# Patient Record
Sex: Female | Born: 1960 | Race: White | Hispanic: No | Marital: Married | Smoking: Never smoker
Health system: Southern US, Community
[De-identification: ages and names within clinical notes are randomized; demographics above are authoritative.]

## PROBLEM LIST (undated history)

## (undated) DIAGNOSIS — F329 Major depressive disorder, single episode, unspecified: Secondary | ICD-10-CM

## (undated) DIAGNOSIS — I10 Essential (primary) hypertension: Secondary | ICD-10-CM

## (undated) DIAGNOSIS — T7840XA Allergy, unspecified, initial encounter: Secondary | ICD-10-CM

## (undated) DIAGNOSIS — J019 Acute sinusitis, unspecified: Secondary | ICD-10-CM

## (undated) DIAGNOSIS — F419 Anxiety disorder, unspecified: Secondary | ICD-10-CM

## (undated) DIAGNOSIS — F32A Depression, unspecified: Secondary | ICD-10-CM

## (undated) DIAGNOSIS — E039 Hypothyroidism, unspecified: Secondary | ICD-10-CM

## (undated) DIAGNOSIS — K219 Gastro-esophageal reflux disease without esophagitis: Secondary | ICD-10-CM

## (undated) DIAGNOSIS — K2970 Gastritis, unspecified, without bleeding: Secondary | ICD-10-CM

## (undated) DIAGNOSIS — J45909 Unspecified asthma, uncomplicated: Secondary | ICD-10-CM

## (undated) DIAGNOSIS — K449 Diaphragmatic hernia without obstruction or gangrene: Secondary | ICD-10-CM

## (undated) DIAGNOSIS — G4730 Sleep apnea, unspecified: Secondary | ICD-10-CM

## (undated) HISTORY — DX: Sleep apnea, unspecified: G47.30

## (undated) HISTORY — PX: GASTRIC BYPASS: SHX52

## (undated) HISTORY — DX: Anxiety disorder, unspecified: F41.9

## (undated) HISTORY — PX: COLECTOMY: SHX59

## (undated) HISTORY — DX: Allergy, unspecified, initial encounter: T78.40XA

---

## 1994-08-22 HISTORY — PX: ABDOMINAL HYSTERECTOMY: SHX81

## 2007-03-10 ENCOUNTER — Ambulatory Visit: Payer: Self-pay | Admitting: Emergency Medicine

## 2007-03-10 ENCOUNTER — Inpatient Hospital Stay: Payer: Self-pay | Admitting: Internal Medicine

## 2008-10-31 IMAGING — CR DG ABDOMEN 3V
1 series · 4 of 4 positions shown · non-contrast
Comparison: none

REASON FOR EXAM: llq abd pain and constipated
COMMENTS:

PROCEDURE:     MDR - MDR ABDOMEN 3-WAY (INCL PA CH)  - March 10, 2007 [DATE]
RESULT:     Comparison: No available comparison exam.

[Series 1: view not recorded · 0.17mm/px · 4 of 4 slices shown]
[im 1/4]
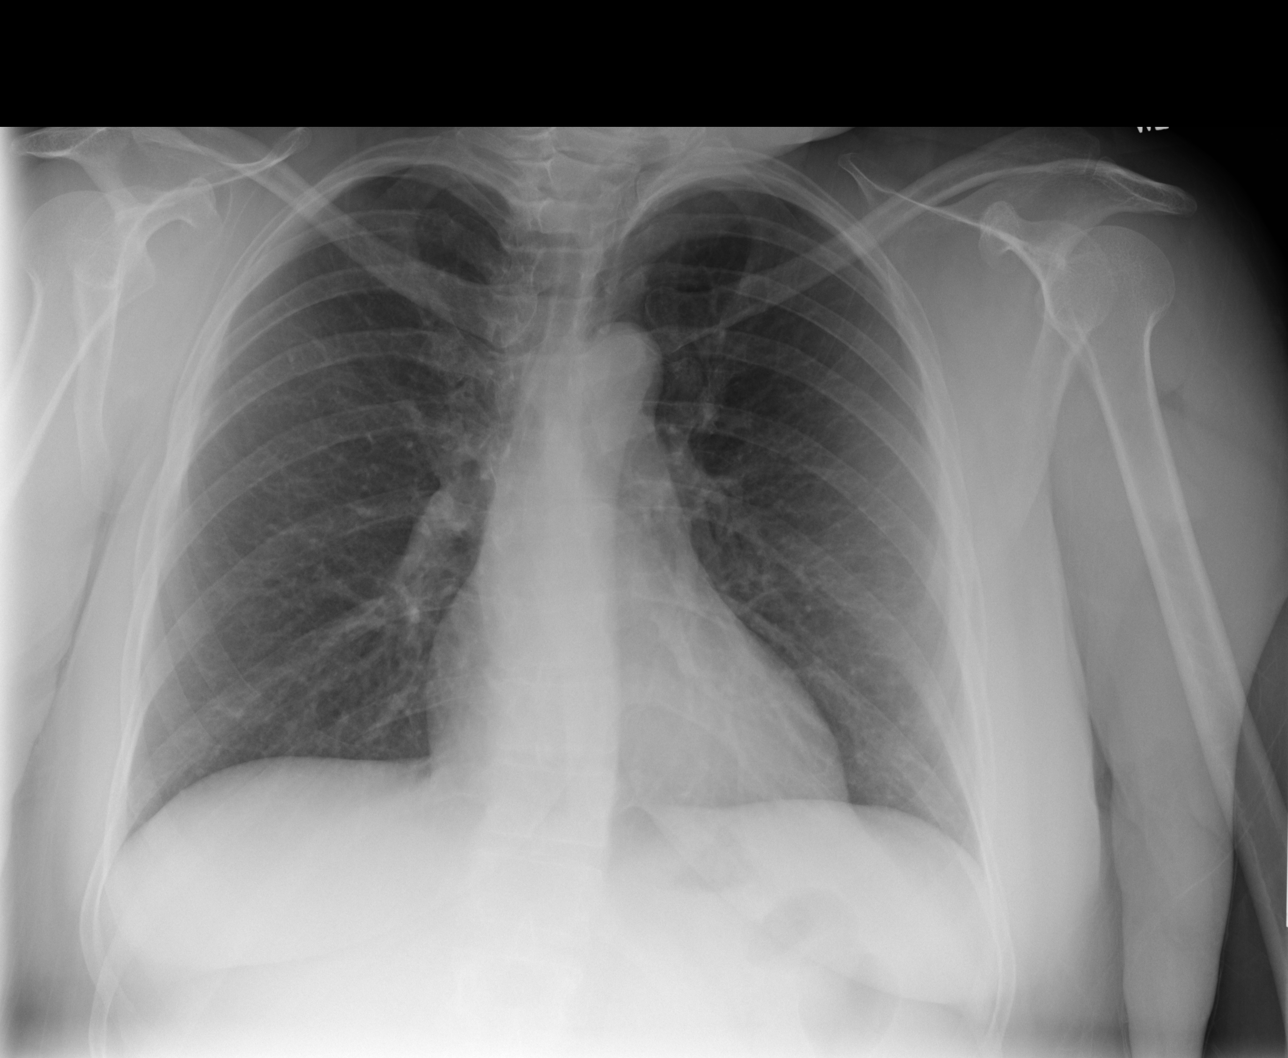
[im 2/4]
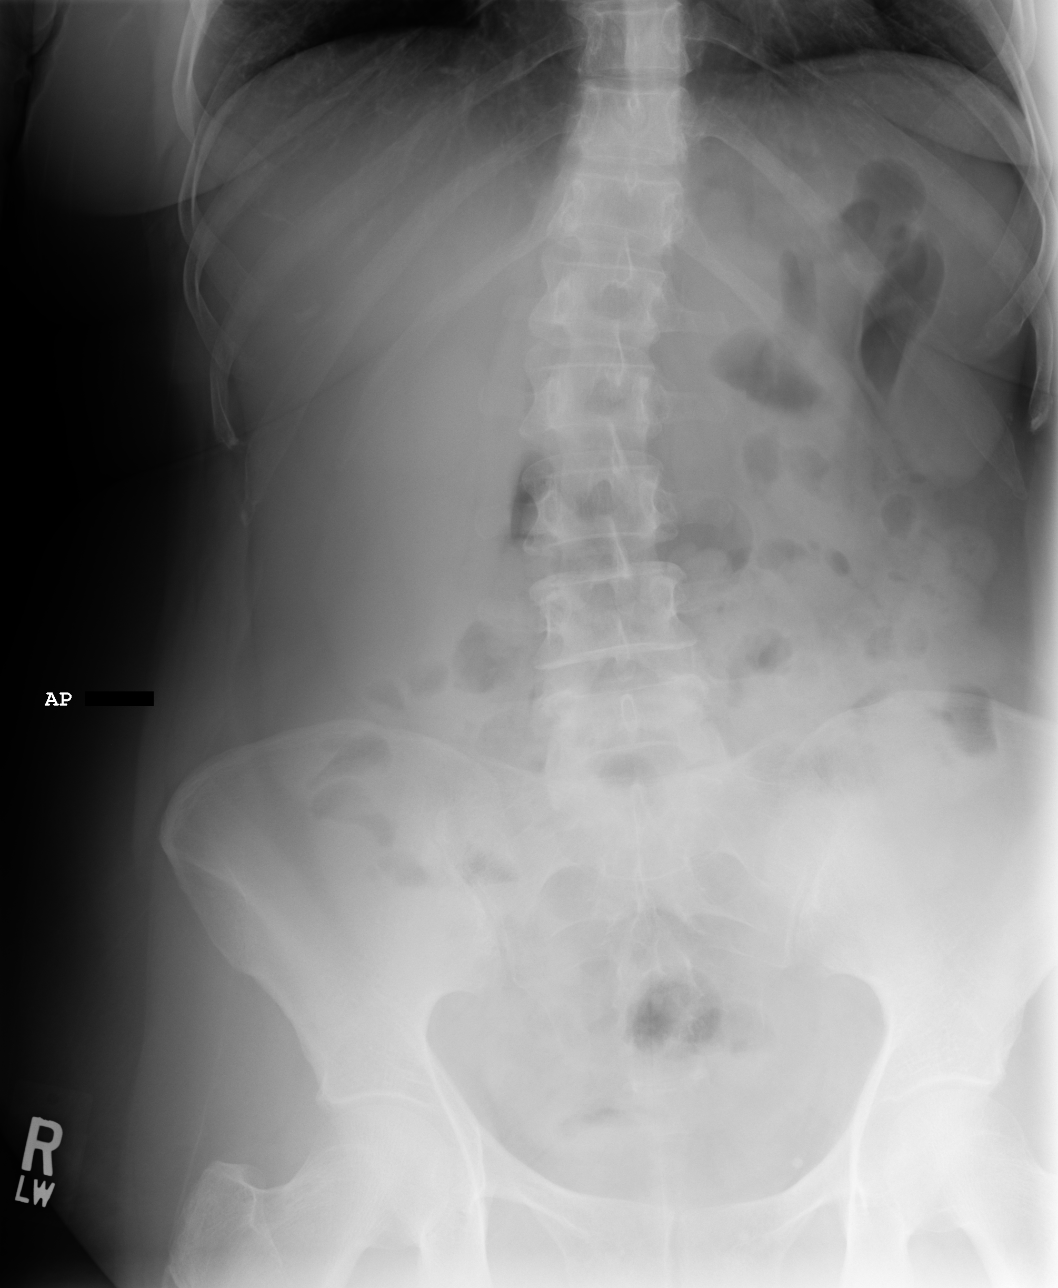
[im 3/4]
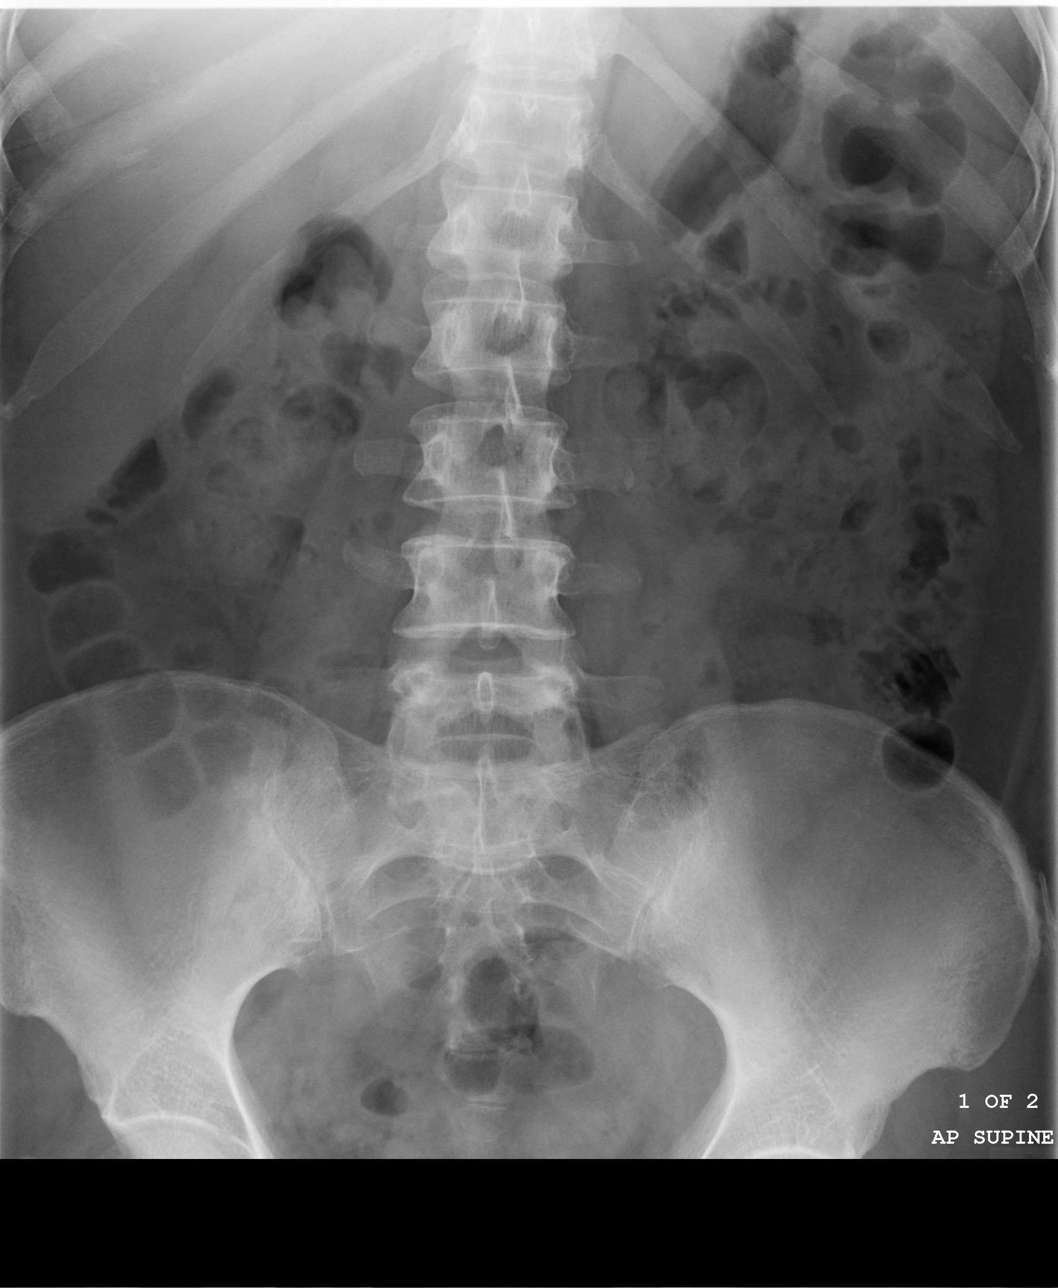
[im 4/4]
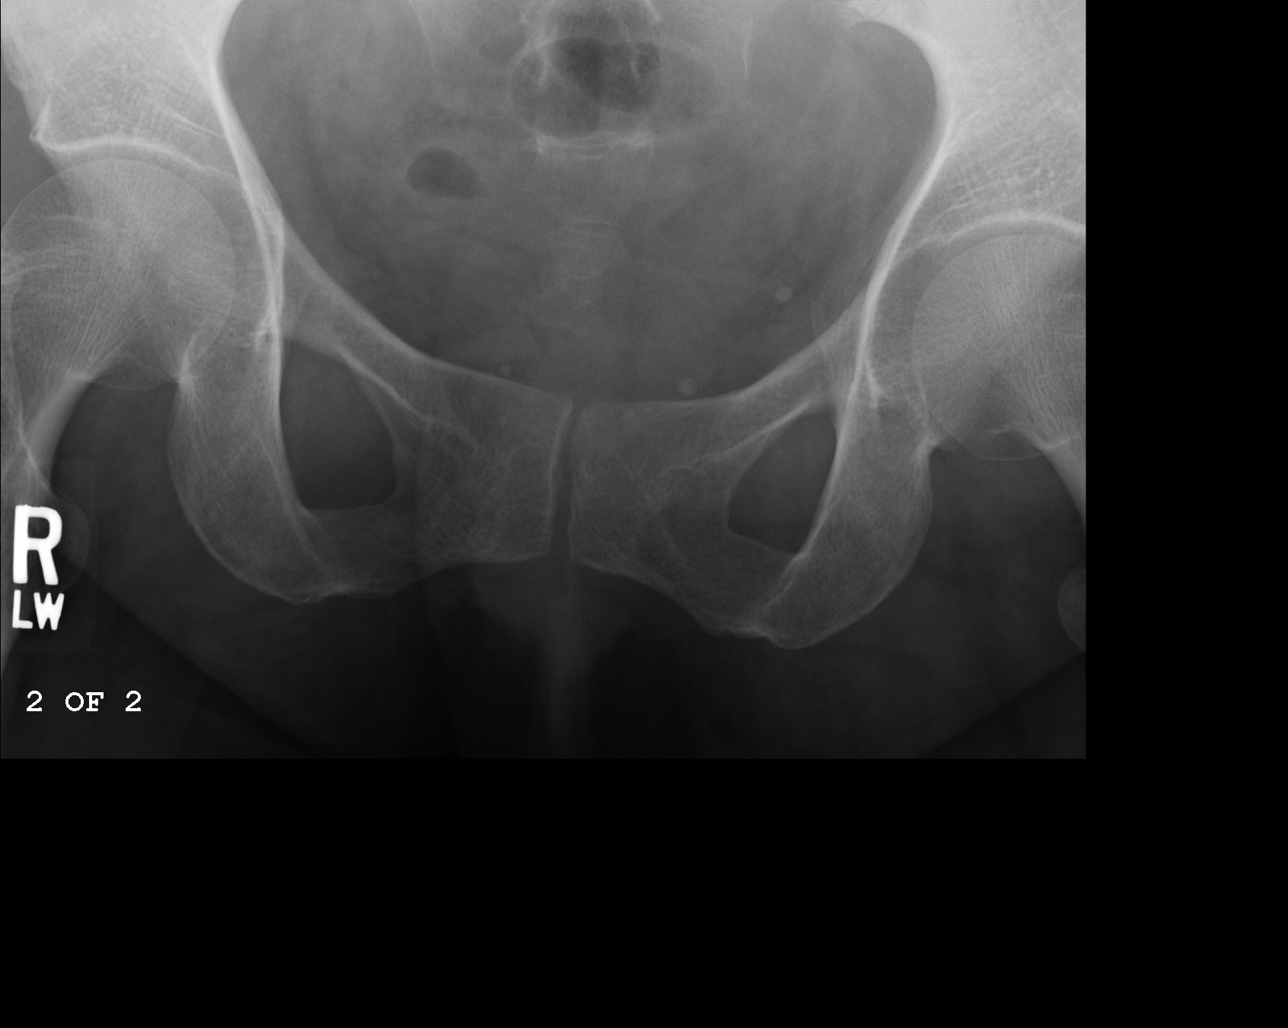

[4 of 4 positions shown; findings below may reference images not displayed]

FINDINGS: There is no significant pulmonary consolidation, pulmonary edema, pleural
effusion, nor pneumothorax. The cardiomediastinal silhouette is within
normal limits.

No displaced rib fracture is noted.

There is nonobstructive bowel gas pattern. Ther is no intraperitoneal free
air. Small amount of stool is noted.
IMPRESSION: 1. Nonobstructive bowel gas pattern. Small amount of stool is noted.

## 2008-10-31 IMAGING — CT CT ABD-PELV W/ CM
1 of 2 series · 15 of 32 positions shown, 19 images · non-contrast
Comparison: none

REASON FOR EXAM: (1) abd pain; (2) abd pain
COMMENTS:

PROCEDURE:     CT  - CT ABDOMEN / PELVIS  W  - March 10, 2007  [DATE]
RESULT:     Comparison: No available comparison exam.
TECHNIQUE: CT examination of the abdomen and pelvis was performed after
intravenous administration of 100 cc of Esovue-HSE nonionic contrast in
addition to oral contrast. Collimation is 5 mm.

[Series 2: abdomen · axial · 0.76mm/px · z∈[-524,-104]mm · 15 of 94 slices shown, 19 images]
[im 5/94  soft-tissue]
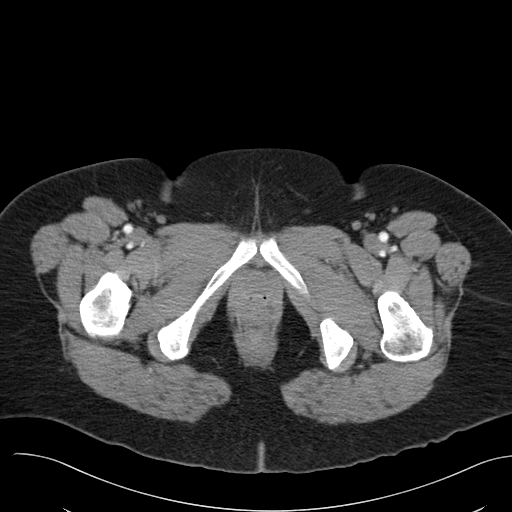
[im 5/94  bone]
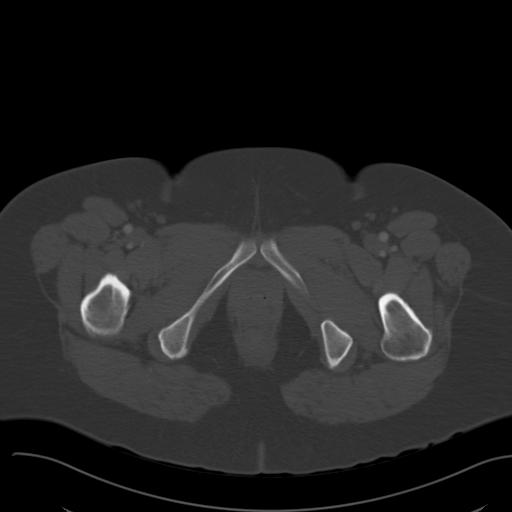
[im 13/94  soft-tissue]
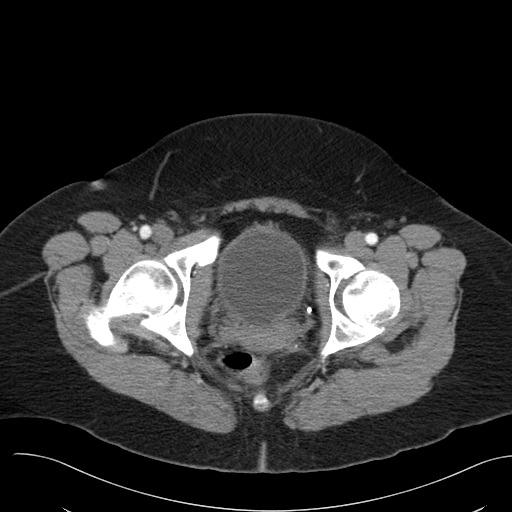
[im 21/94  soft-tissue]
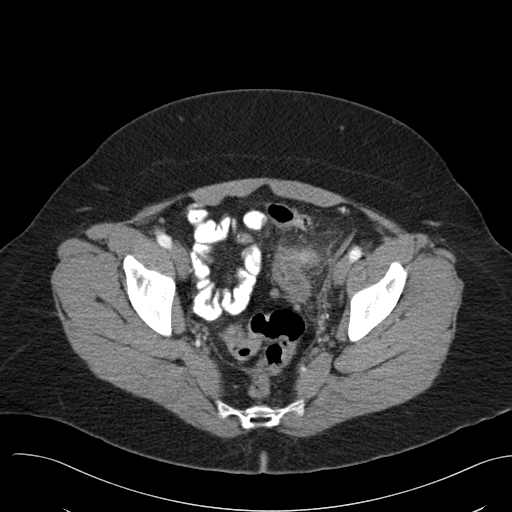
[im 25/94  soft-tissue]
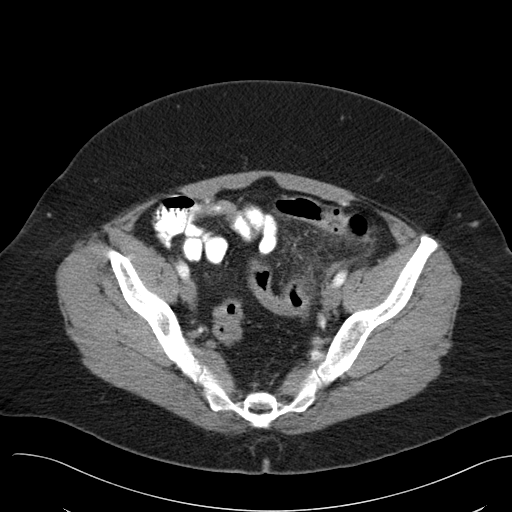
[im 33/94  soft-tissue]
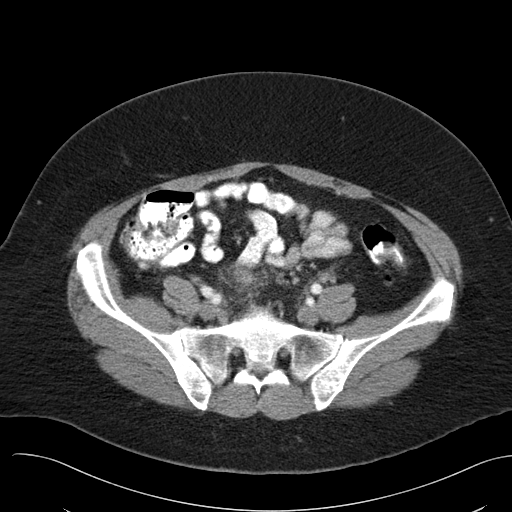
[im 41/94  soft-tissue]
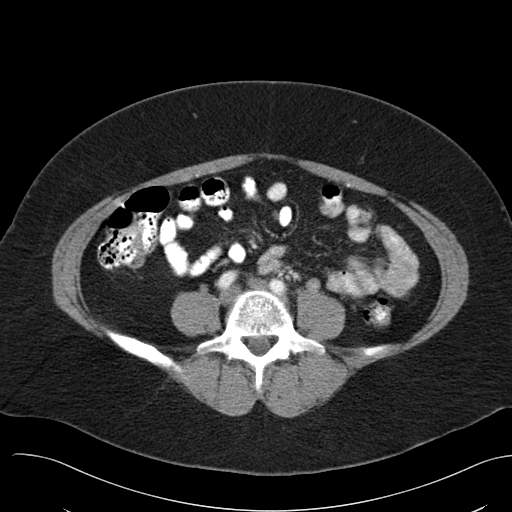
[im 49/94  soft-tissue]
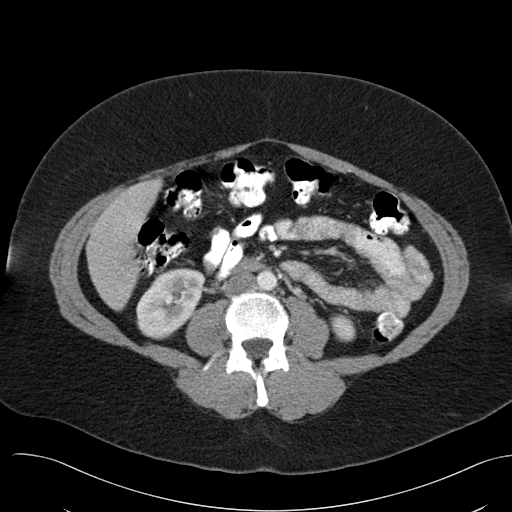
[im 53/94  soft-tissue]
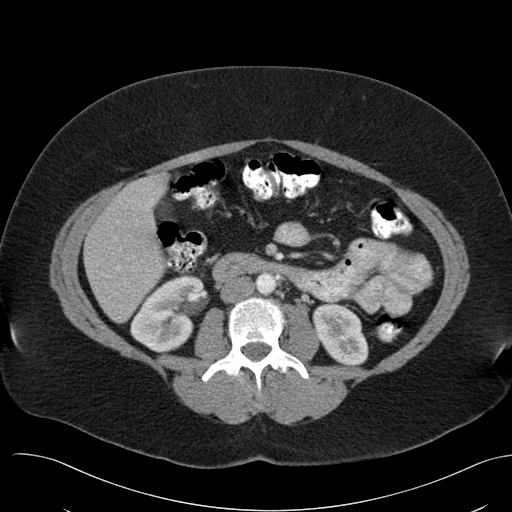
[im 61/94  soft-tissue]
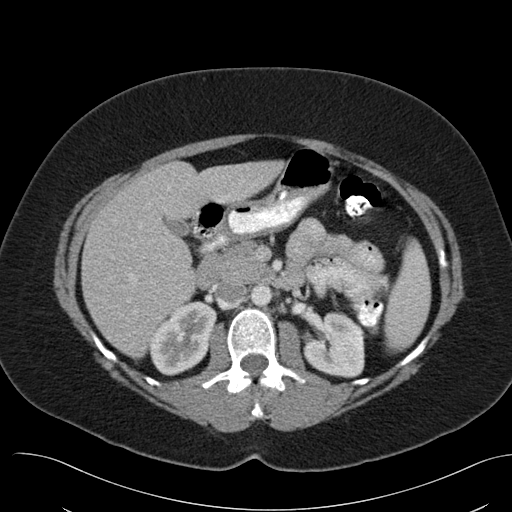
[im 61/94  bone]
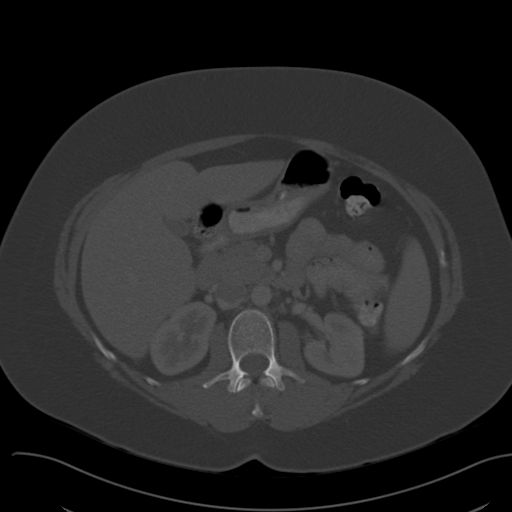
[im 69/94  soft-tissue]
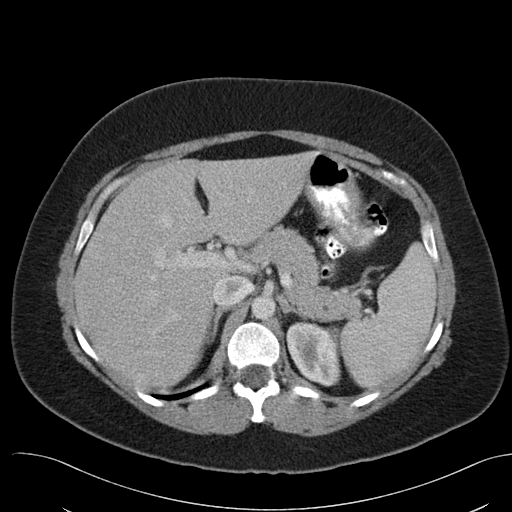
[im 73/94  soft-tissue]
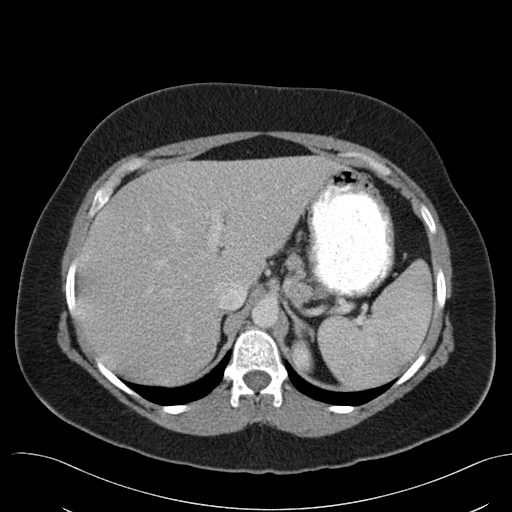
[im 77/94  lung]
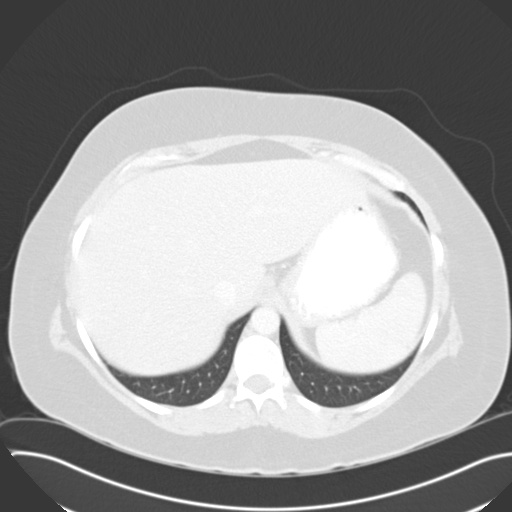
[im 81/94  soft-tissue]
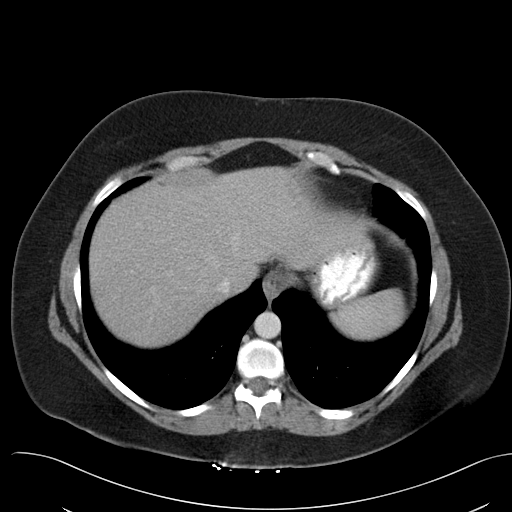
[im 81/94  lung]
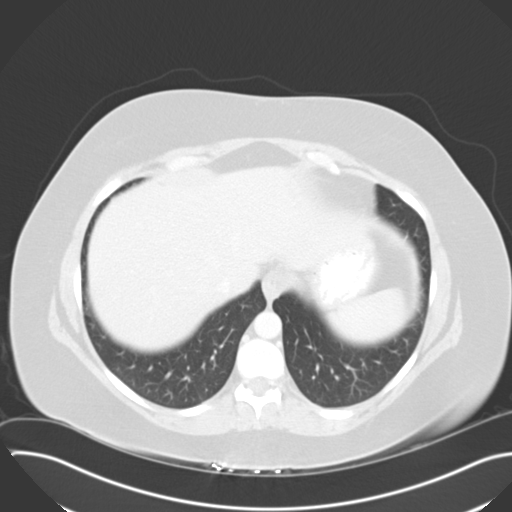
[im 85/94  lung]
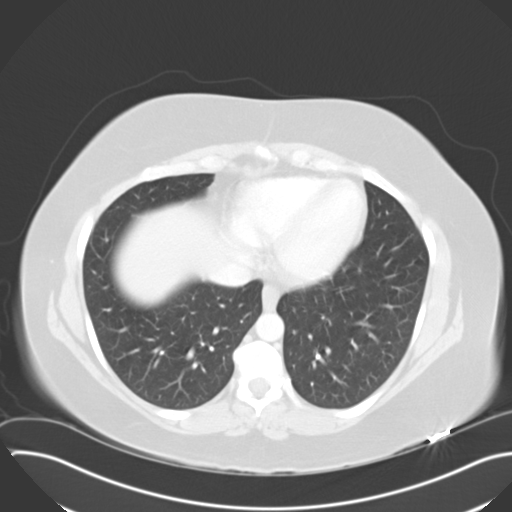
[im 89/94  soft-tissue]
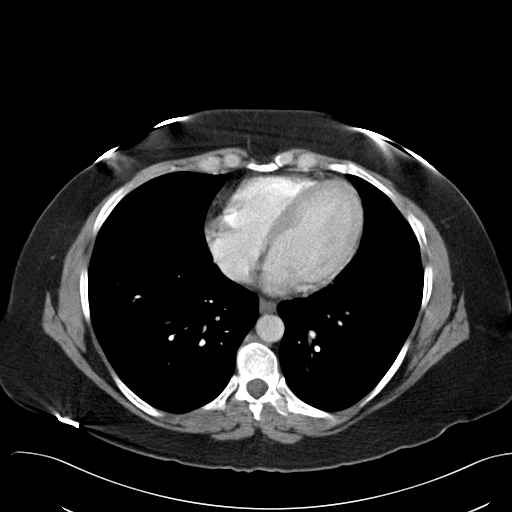
[im 89/94  lung]
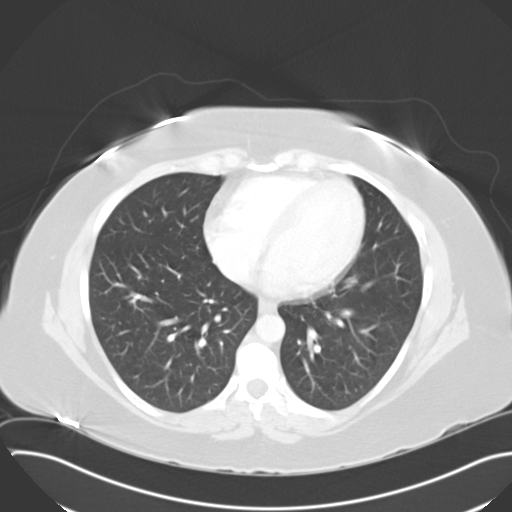

[15 of 32 positions shown; findings below may reference images not displayed]

FINDINGS: Limited evaluation of the lung bases is unremarkable

The liver, gallbladder, spleen, adrenal glands and kidneys are unremarkable.
3 mm low attenuation focus is seen involving the pancreatic tail is
nonspecific and of uncertain clinical significance.

There is sigmoid colonic wall thickening with adjacent fat stranding,
consistent with diverticulitis. There is no bowel obstruction. The appendix
is now well seen. There is no intraperitoneal free air. There are no
enlarged abdominal pelvic lymph nodes.
IMPRESSION: 1. Findings are suggestive of sigmoid diverticulitis. There is no evidence
of gross intraperitoneal free air. There is no abscess. If patient has not
had a recent colonoscopy, would recommend colonoscopy after the acute
management of diverticulitis to exclude any underlying colonic neoplasm.

2. 3 mm low attenuation focus in the pancreatic tail is nonspecific and of
uncertain clinical significance.

Findings were called to the ER at [DATE] on 03/10/07.

## 2011-04-11 DIAGNOSIS — F329 Major depressive disorder, single episode, unspecified: Secondary | ICD-10-CM | POA: Insufficient documentation

## 2011-04-11 DIAGNOSIS — I10 Essential (primary) hypertension: Secondary | ICD-10-CM | POA: Insufficient documentation

## 2011-04-11 DIAGNOSIS — M8580 Other specified disorders of bone density and structure, unspecified site: Secondary | ICD-10-CM | POA: Insufficient documentation

## 2011-04-11 DIAGNOSIS — E039 Hypothyroidism, unspecified: Secondary | ICD-10-CM | POA: Insufficient documentation

## 2011-04-11 DIAGNOSIS — E669 Obesity, unspecified: Secondary | ICD-10-CM | POA: Insufficient documentation

## 2012-07-25 DIAGNOSIS — K2970 Gastritis, unspecified, without bleeding: Secondary | ICD-10-CM | POA: Insufficient documentation

## 2012-07-25 DIAGNOSIS — K449 Diaphragmatic hernia without obstruction or gangrene: Secondary | ICD-10-CM | POA: Insufficient documentation

## 2012-07-25 DIAGNOSIS — K222 Esophageal obstruction: Secondary | ICD-10-CM | POA: Insufficient documentation

## 2013-03-25 ENCOUNTER — Ambulatory Visit: Payer: Self-pay | Admitting: Internal Medicine

## 2015-10-03 ENCOUNTER — Ambulatory Visit
Admission: EM | Admit: 2015-10-03 | Discharge: 2015-10-03 | Disposition: A | Discharge status: Home / Self Care | Payer: BLUE CROSS/BLUE SHIELD | Attending: Family Medicine | Admitting: Family Medicine

## 2015-10-03 ENCOUNTER — Encounter: Payer: Self-pay | Admitting: Gynecology

## 2015-10-03 DIAGNOSIS — J45901 Unspecified asthma with (acute) exacerbation: Secondary | ICD-10-CM

## 2015-10-03 DIAGNOSIS — J0100 Acute maxillary sinusitis, unspecified: Secondary | ICD-10-CM

## 2015-10-03 HISTORY — DX: Unspecified asthma, uncomplicated: J45.909

## 2015-10-03 HISTORY — DX: Diaphragmatic hernia without obstruction or gangrene: K44.9

## 2015-10-03 HISTORY — DX: Major depressive disorder, single episode, unspecified: F32.9

## 2015-10-03 HISTORY — DX: Gastritis, unspecified, without bleeding: K29.70

## 2015-10-03 HISTORY — DX: Essential (primary) hypertension: I10

## 2015-10-03 HISTORY — DX: Gastro-esophageal reflux disease without esophagitis: K21.9

## 2015-10-03 HISTORY — DX: Hypothyroidism, unspecified: E03.9

## 2015-10-03 HISTORY — DX: Depression, unspecified: F32.A

## 2015-10-03 HISTORY — DX: Acute sinusitis, unspecified: J01.90

## 2015-10-03 MED ORDER — IPRATROPIUM-ALBUTEROL 0.5-2.5 (3) MG/3ML IN SOLN
3.0000 mL | Freq: Once | RESPIRATORY_TRACT | Status: AC
  Administered 2015-10-03: 3 mL via RESPIRATORY_TRACT

## 2015-10-03 MED ORDER — AZITHROMYCIN 250 MG PO TABS: ORAL_TABLET | ORAL | Status: DC

## 2015-10-03 MED ORDER — GUAIFENESIN-CODEINE 100-10 MG/5ML PO SOLN: ORAL | Status: DC

## 2015-10-03 MED ORDER — FLUCONAZOLE 150 MG PO TABS: 150.0000 mg | ORAL_TABLET | Freq: Every day | ORAL | Status: DC

## 2015-10-03 MED ORDER — PREDNISONE 20 MG PO TABS: ORAL_TABLET | ORAL | Status: DC

## 2015-10-03 NOTE — ED Notes (Addendum)
Patient c/o coughing / shortness of breath / and her peak flow is less than 200 x last pm. Per patient use her asthma medication when needed. Per patient taken her Advair / Albuterol and her Ventolin inhaler but not feeling any better. Patient stated coughing greenish mucous.

## 2015-10-03 NOTE — ED Provider Notes (Signed)
CSN: [id number]     Arrival date & time 10/03/15  0806 History   First MD Initiated Contact with Patient 10/03/15 [phone]     Chief Complaint  Patient presents with  . Asthma   (Consider location/radiation/quality/duration/timing/severity/associated sxs/prior Treatment) Patient is a 55 y.o. female presenting with asthma and URI. The history is provided by the patient.  Asthma Associated symptoms include headaches.  URI Presenting symptoms: congestion, cough, facial pain, fatigue, fever and rhinorrhea   Severity:  Moderate Onset quality:  Sudden Duration:  2 weeks Timing:  Constant Progression:  Worsening Chronicity:  New Relieved by:  Nothing Ineffective treatments:  OTC medications and prescription medications Associated symptoms: headaches, sinus pain and wheezing   Associated symptoms: no neck pain, no sneezing and no swollen glands   Risk factors: chronic respiratory disease (mild, persistent asthma)     Past Medical History  Diagnosis Date  . Hypertension   . Hypothyroidism   . Asthma   . Acute sinusitis   . Gastritis   . Hiatal hernia   . GERD (gastroesophageal reflux disease)   . Depression    Past Surgical History  Procedure Laterality Date  . Abdominal hysterectomy    . Colectomy     No family history on file. Social History  Substance Use Topics  . Smoking status: Never Smoker   . Smokeless tobacco: None  . Alcohol Use: No   OB History    No data available     Review of Systems  Constitutional: Positive for fever and fatigue.  HENT: Positive for congestion and rhinorrhea. Negative for sneezing.   Respiratory: Positive for cough and wheezing.   Musculoskeletal: Negative for neck pain.  Neurological: Positive for headaches.    Allergies  Wheat bran and Phentermine  Home Medications   Prior to Admission medications   Medication Sig Start Date End Date Taking? Authorizing Provider  albuterol (PROVENTIL HFA;VENTOLIN HFA) 108 (90 Base) MCG/ACT  inhaler Inhale into the lungs every 6 (six) hours as needed for wheezing or shortness of breath.   Yes Historical Provider, MD  aspirin 81 MG tablet Take 81 mg by mouth daily.   Yes Historical Provider, MD  cetirizine (ZYRTEC) 10 MG tablet Take 10 mg by mouth daily.   Yes Historical Provider, MD  fluticasone (FLONASE) 50 MCG/ACT nasal spray Place into both nostrils daily.   Yes Historical Provider, MD  Fluticasone-Salmeterol (ADVAIR) 250-50 MCG/DOSE AEPB Inhale 1 puff into the lungs 2 (two) times daily.   Yes Historical Provider, MD  levothyroxine (SYNTHROID, LEVOTHROID) 100 MCG tablet Take 100 mcg by mouth daily before breakfast.   Yes Historical Provider, MD  montelukast (SINGULAIR) 10 MG tablet Take 10 mg by mouth at bedtime.   Yes Historical Provider, MD  ramipril (ALTACE) 10 MG capsule Take 10 mg by mouth daily.   Yes Historical Provider, MD  sertraline (ZOLOFT) 100 MG tablet Take 200 mg by mouth daily.   Yes Historical Provider, MD  traZODone (DESYREL) 50 MG tablet Take 50 mg by mouth at bedtime.   Yes Historical Provider, MD  azithromycin (ZITHROMAX Z-PAK) 250 MG tablet 2 tabs po once day 1, then 1 tab po qd for next 4 days 10/03/15   Norval Gable, MD  fluconazole (DIFLUCAN) 150 MG tablet Take 1 tablet (150 mg total) by mouth daily. 10/03/15   Norval Gable, MD  guaiFENesin-codeine 100-10 MG/5ML syrup 10 ml po q 8 hours prn cough 10/03/15   Norval Gable, MD  predniSONE (DELTASONE) 20 MG tablet  3 tabs po qd for 2 days, then 2 tabs po qd for 4 days, then 1 tab po qd for 4 days, then half a tab po qd for 3 days. 10/03/15   Norval Gable, MD   Meds Ordered and Administered this Visit   Medications  ipratropium-albuterol (DUONEB) 0.5-2.5 (3) MG/3ML nebulizer solution 3 mL (3 mLs Nebulization Given 10/03/15 0918)    BP 135/69 mmHg  Pulse 83  Temp(Src) 98.7 F (37.1 C) (Oral)  Resp 18  Ht 5\' 5"  (1.651 m)  Wt 220 lb (99.791 kg)  BMI 36.61 kg/m2  SpO2 96% No data found.   Physical Exam   Constitutional: She appears well-developed and well-nourished. No distress.  HENT:  Head: Normocephalic and atraumatic.  Right Ear: Tympanic membrane, external ear and ear canal normal.  Left Ear: Tympanic membrane, external ear and ear canal normal.  Nose: Mucosal edema and rhinorrhea present. No nose lacerations, sinus tenderness, nasal deformity, septal deviation or nasal septal hematoma. No epistaxis.  No foreign bodies. Right sinus exhibits maxillary sinus tenderness and frontal sinus tenderness. Left sinus exhibits maxillary sinus tenderness and frontal sinus tenderness.  Mouth/Throat: Uvula is midline, oropharynx is clear and moist and mucous membranes are normal. No oropharyngeal exudate.  Eyes: Conjunctivae and EOM are normal. Pupils are equal, round, and reactive to light. Right eye exhibits no discharge. Left eye exhibits no discharge. No scleral icterus.  Neck: Normal range of motion. Neck supple. No thyromegaly present.  Cardiovascular: Normal rate, regular rhythm and normal heart sounds.   Pulmonary/Chest: Effort normal. No respiratory distress (diffuse expiratory wheezes and rhonchi bilaterally). She has wheezes. She has no rales.  Lymphadenopathy:    She has no cervical adenopathy.  Skin: She is not diaphoretic.  Nursing note and vitals reviewed.   ED Course  Procedures (including critical care time)  Labs Review Labs Reviewed - No data to display  Imaging Review No results found.   Visual Acuity Review  Right Eye Distance:   Left Eye Distance:   Bilateral Distance:    Right Eye Near:   Left Eye Near:    Bilateral Near:         MDM   1. Asthma exacerbation   2. Acute maxillary sinusitis, recurrence not specified    New Prescriptions   AZITHROMYCIN (ZITHROMAX Z-PAK) 250 MG TABLET    2 tabs po once day 1, then 1 tab po qd for next 4 days   FLUCONAZOLE (DIFLUCAN) 150 MG TABLET    Take 1 tablet (150 mg total) by mouth daily.   GUAIFENESIN-CODEINE 100-10  MG/5ML SYRUP    10 ml po q 8 hours prn cough   PREDNISONE (DELTASONE) 20 MG TABLET    3 tabs po qd for 2 days, then 2 tabs po qd for 4 days, then 1 tab po qd for 4 days, then half a tab po qd for 3 days.   1. diagnosis reviewed with patient 2. rx as per orders above; reviewed possible side effects, interactions, risks and benefits  3. Recommend supportive treatment with increased fluids, rest, otc cough medication prn 4. Follow-up prn if symptoms worsen or don't improve    Norval Gable, MD 10/03/15 [phone]

## 2015-12-25 DIAGNOSIS — G4733 Obstructive sleep apnea (adult) (pediatric): Secondary | ICD-10-CM | POA: Insufficient documentation

## 2016-01-12 DIAGNOSIS — J45909 Unspecified asthma, uncomplicated: Secondary | ICD-10-CM | POA: Insufficient documentation

## 2016-10-26 DIAGNOSIS — N6489 Other specified disorders of breast: Secondary | ICD-10-CM | POA: Insufficient documentation

## 2017-01-06 DIAGNOSIS — M2550 Pain in unspecified joint: Secondary | ICD-10-CM | POA: Insufficient documentation

## 2017-01-06 DIAGNOSIS — M654 Radial styloid tenosynovitis [de Quervain]: Secondary | ICD-10-CM | POA: Insufficient documentation

## 2017-01-06 DIAGNOSIS — D179 Benign lipomatous neoplasm, unspecified: Secondary | ICD-10-CM | POA: Insufficient documentation

## 2017-01-06 DIAGNOSIS — M5136 Other intervertebral disc degeneration, lumbar region: Secondary | ICD-10-CM | POA: Insufficient documentation

## 2017-01-06 DIAGNOSIS — M7050 Other bursitis of knee, unspecified knee: Secondary | ICD-10-CM | POA: Insufficient documentation

## 2017-01-06 DIAGNOSIS — M7061 Trochanteric bursitis, right hip: Secondary | ICD-10-CM | POA: Insufficient documentation

## 2017-01-25 DIAGNOSIS — Z800 Family history of malignant neoplasm of digestive organs: Secondary | ICD-10-CM | POA: Insufficient documentation

## 2017-10-26 DIAGNOSIS — M130 Polyarthritis, unspecified: Secondary | ICD-10-CM | POA: Insufficient documentation

## 2017-10-26 DIAGNOSIS — Z0000 Encounter for general adult medical examination without abnormal findings: Secondary | ICD-10-CM | POA: Insufficient documentation

## 2018-05-23 DIAGNOSIS — N6320 Unspecified lump in the left breast, unspecified quadrant: Secondary | ICD-10-CM | POA: Insufficient documentation

## 2018-11-06 ENCOUNTER — Ambulatory Visit: Payer: Self-pay | Admitting: Family Medicine

## 2018-11-15 ENCOUNTER — Ambulatory Visit: Payer: Self-pay | Admitting: Family Medicine

## 2019-01-07 ENCOUNTER — Encounter: Payer: Self-pay | Admitting: Family Medicine

## 2019-01-07 ENCOUNTER — Ambulatory Visit (INDEPENDENT_AMBULATORY_CARE_PROVIDER_SITE_OTHER): Payer: BLUE CROSS/BLUE SHIELD | Admitting: Family Medicine

## 2019-01-07 ENCOUNTER — Other Ambulatory Visit: Payer: Self-pay

## 2019-01-07 VITALS — BP 138/70 | HR 68 | Ht 65.0 in | Wt 234.0 lb

## 2019-01-07 DIAGNOSIS — R7989 Other specified abnormal findings of blood chemistry: Secondary | ICD-10-CM

## 2019-01-07 DIAGNOSIS — M545 Low back pain, unspecified: Secondary | ICD-10-CM

## 2019-01-07 DIAGNOSIS — G8929 Other chronic pain: Secondary | ICD-10-CM

## 2019-01-07 DIAGNOSIS — E782 Mixed hyperlipidemia: Secondary | ICD-10-CM

## 2019-01-07 DIAGNOSIS — I10 Essential (primary) hypertension: Secondary | ICD-10-CM | POA: Diagnosis not present

## 2019-01-07 DIAGNOSIS — M2550 Pain in unspecified joint: Secondary | ICD-10-CM

## 2019-01-07 DIAGNOSIS — E034 Atrophy of thyroid (acquired): Secondary | ICD-10-CM

## 2019-01-07 DIAGNOSIS — Z7689 Persons encountering health services in other specified circumstances: Secondary | ICD-10-CM

## 2019-01-07 DIAGNOSIS — Z6838 Body mass index (BMI) 38.0-38.9, adult: Secondary | ICD-10-CM

## 2019-01-07 MED ORDER — MELOXICAM 7.5 MG PO TABS: 7.5000 mg | ORAL_TABLET | Freq: Every day | ORAL | 5 refills | Status: DC

## 2019-01-07 MED ORDER — LEVOTHYROXINE SODIUM 100 MCG PO TABS: 100.0000 ug | ORAL_TABLET | Freq: Every day | ORAL | 1 refills | Status: DC

## 2019-01-07 MED ORDER — RAMIPRIL 10 MG PO CAPS: 10.0000 mg | ORAL_CAPSULE | Freq: Every day | ORAL | 1 refills | Status: DC

## 2019-01-07 NOTE — Patient Instructions (Signed)

## 2019-01-07 NOTE — Addendum Note (Signed)
Addended by: Juline Patch on: 01/07/2019 12:25 PM   Modules accepted: Level of Service

## 2019-01-07 NOTE — Progress Notes (Signed)
Date:  01/07/2019   Name:  Molly Horn   DOB:  [birth year]   MRN:  [id number]   Chief Complaint: [place] (needs repeat cbc - elevated platelets and a lipid) and Arthritis  Patient is a 58 year old female who presents for an establish care exam. The patient reports the following problems: multiple medical issues/backpain. Health maintenance has been reviewed up to date  Arthritis  Presents for initial visit. The disease course has been fluctuating. She complains of pain and stiffness. Affected locations include the neck, left shoulder, right shoulder, left hip, right hip, right knee, left knee, right PIP, left PIP, right DIP, left DIP and right MCP. Pertinent negatives include no diarrhea, dry eyes, dry mouth, dysuria, fatigue, fever, pain at night, pain while resting, rash, Raynaud's syndrome, uveitis or weight loss. There is no history of chronic back pain, lupus, osteoarthritis, psoriasis or rheumatoid arthritis.  Past treatments include OTC med. The treatment provided moderate relief. Factors aggravating her arthritis include activity.  Hyperlipidemia  This is a chronic problem. The current episode started more than 1 year ago. The problem is uncontrolled. Recent lipid tests were reviewed and are normal. Exacerbating diseases include hypothyroidism and obesity. She has no history of chronic renal disease, diabetes, liver disease or nephrotic syndrome. Pertinent negatives include no chest pain, myalgias or shortness of breath. Current antihyperlipidemic treatment includes diet change.  Hypertension  This is a chronic problem. The current episode started more than 1 year ago. The problem has been gradually worsening since onset. The problem is controlled. Pertinent negatives include no anxiety, blurred vision, chest pain, headaches, malaise/fatigue, neck pain, orthopnea, palpitations, peripheral edema, PND, shortness of breath or sweats. There are no associated agents to hypertension. Risk  factors for coronary artery disease include dyslipidemia and obesity. Past treatments include ACE inhibitors. The current treatment provides moderate improvement. Identifiable causes of hypertension include a thyroid problem. There is no history of chronic renal disease.  Thyroid Problem  Presents for follow-up visit. Symptoms include weight gain. Patient reports no anxiety, cold intolerance, constipation, depressed mood, diaphoresis, diarrhea, fatigue, hair loss, heat intolerance, hoarse voice, leg swelling, nail problem, palpitations, tremors, visual change or weight loss. Her past medical history is significant for hyperlipidemia. There is no history of diabetes.    Review of Systems  Constitutional: Positive for weight gain. Negative for chills, diaphoresis, fatigue, fever, malaise/fatigue and weight loss.  HENT: Negative for drooling, ear discharge, ear pain, hoarse voice and sore throat.   Eyes: Negative for blurred vision.  Respiratory: Negative for cough, shortness of breath and wheezing.   Cardiovascular: Negative for chest pain, palpitations, orthopnea, leg swelling and PND.  Gastrointestinal: Negative for abdominal pain, blood in stool, constipation, diarrhea and nausea.  Endocrine: Negative for cold intolerance, heat intolerance and polydipsia.  Genitourinary: Negative for dysuria, frequency, hematuria and urgency.  Musculoskeletal: Positive for arthralgias, arthritis, back pain and stiffness. Negative for myalgias and neck pain.  Skin: Negative for rash.  Allergic/Immunologic: Negative for environmental allergies.  Neurological: Negative for dizziness, tremors, numbness and headaches.  Hematological: Does not bruise/bleed easily.  Psychiatric/Behavioral: Negative for suicidal ideas. The patient is not nervous/anxious.     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   Routine history and physical  examination of adult 10/26/2017   Family history of colon cancer 01/25/2017   Arthralgia 01/06/2017   Degenerative lumbar disc 01/06/2017   Lipoma 01/06/2017  Pes anserine bursitis 01/06/2017   Tenosynovitis, de Quervain 01/06/2017   Trochanteric bursitis of both hips 01/06/2017   Radial scar of breast 10/26/2016   Asthma without status asthmaticus 01/12/2016   Moderate obstructive sleep apnea 12/25/2015   Gastritis 07/25/2012   Hiatal hernia 07/25/2012   Schatzki's ring 07/25/2012   Acquired hypothyroidism 04/11/2011   Obesity 04/11/2011   Depression 04/11/2011   Essential hypertension 04/11/2011   Osteopenia 04/11/2011    Allergies  Allergen Reactions   Wheat Bran Anaphylaxis   Phentermine     Make her sick    Past Surgical History:  Procedure Laterality Date   ABDOMINAL HYSTERECTOMY  1996   COLECTOMY     resection due to removal of mass benign    Social History   Tobacco Use   Smoking status: Never Smoker   Smokeless tobacco: Never Used  Substance Use Topics   Alcohol use: Yes    Comment: socially   Drug use: No     Medication list has been reviewed and updated.  Current Meds  Medication Sig   albuterol (PROVENTIL HFA;VENTOLIN HFA) 108 (90 Base) MCG/ACT inhaler Inhale into the lungs every 6 (six) hours as needed for wheezing or shortness of breath.   aspirin 81 MG tablet Take 81 mg by mouth daily.   levothyroxine (SYNTHROID) 100 MCG tablet Take 1 tablet by mouth daily.   levothyroxine (SYNTHROID, LEVOTHROID) 100 MCG tablet Take 100 mcg by mouth daily before breakfast.   Multiple Vitamin (MULTIVITAMIN) capsule Take 1 capsule by mouth daily.   ramipril (ALTACE) 10 MG capsule Take 10 mg by mouth daily.   venlafaxine XR (EFFEXOR-XR) 150 MG 24 hr capsule Take 1 capsule by mouth daily. psych    PHQ 2/9 Scores 01/07/2019  PHQ - 2 Score 1  PHQ- 9 Score 2    BP Readings from Last 3 Encounters:  01/07/19 138/70  10/03/15  135/69    Physical Exam Vitals signs and nursing note reviewed.  Constitutional:      General: She is not in acute distress.    Appearance: She is not diaphoretic.  HENT:     Head: Normocephalic and atraumatic.     Right Ear: Tympanic membrane, ear canal and external ear normal.     Left Ear: Tympanic membrane, ear canal and external ear normal.     Nose: Nose normal.  Eyes:     General:        Right eye: No discharge.        Left eye: No discharge.     Conjunctiva/sclera: Conjunctivae normal.     Pupils: Pupils are equal, round, and reactive to light.     Funduscopic exam:    Right eye: No AV nicking.        Left eye: No AV nicking.  Neck:     Musculoskeletal: Normal range of motion and neck supple.     Thyroid: No thyromegaly.     Vascular: No JVD.  Cardiovascular:     Rate and Rhythm: Normal rate and regular rhythm.     Heart sounds: Normal heart sounds. No murmur. No friction rub. No gallop.   Pulmonary:     Effort: Pulmonary effort is normal.     Breath sounds: Normal breath sounds.  Abdominal:     General: Bowel sounds are normal.     Palpations: Abdomen is soft. There is no mass.     Tenderness: There is no abdominal tenderness. There is no guarding.  Musculoskeletal: Normal  range of motion.     Lumbar back: She exhibits spasm.       Back:     Comments: Palpable subcutaneous nodule over left SI joint  Lymphadenopathy:     Cervical: No cervical adenopathy.  Skin:    General: Skin is warm and dry.  Neurological:     Mental Status: She is alert.     Deep Tendon Reflexes: Reflexes are normal and symmetric.     Wt Readings from Last 3 Encounters:  01/07/19 234 lb (106.1 kg)  10/03/15 220 lb (99.8 kg)    BP 138/70    Pulse 68    Ht 5\' 5"  (1.651 m)    Wt 234 lb (106.1 kg)    BMI 38.94 kg/m   Assessment and Plan:  1. BMI 38.0-38.9,adult Health risks of being over weight were discussed and patient was counseled on weight loss options and exercise.  2.  Establishing care with new doctor, encounter for And establishes with new physician.  Patient's medications diagnoses and previous labs and x-rays were reviewed.  3. Mixed hyperlipidemia Patient has had some elevation in lipids in the past and will recheck lipid panel to evaluate this. - Lipid panel  4. Essential hypertension Controlled.  Chronic.  Continue ramipril 10 mg once a day.   - ramipril (ALTACE) 10 MG capsule; Take 1 capsule (10 mg total) by mouth daily.  Dispense: 90 capsule; Refill: 1  5. Arthralgia, unspecified joint We will check a sed rate because of multiple areas of arthralgias.  Will start on Mobic 7.5 mg once a day. - Sedimentation rate - meloxicam (MOBIC) 7.5 MG tablet; Take 1 tablet (7.5 mg total) by mouth daily.  Dispense: 30 tablet; Refill: 5  6. Chronic left-sided low back pain without sciatica Patient has lower back pain for chronic period of time will initiate meloxicam 7.5 mg - Sedimentation rate - meloxicam (MOBIC) 7.5 MG tablet; Take 1 tablet (7.5 mg total) by mouth daily.  Dispense: 30 tablet; Refill: 5  7. Hypothyroidism due to acquired atrophy of thyroid Patient has history of hypothyroidism.  He is currently controlled on levothyroxine 100 mcg daily. - levothyroxine (SYNTHROID) 100 MCG tablet; Take 1 tablet by mouth daily.  8. Elevated platelet count Patient had a mildly elevated platelet count on a previous CBC.  We will recheck a platelet count to see if this is returned to normal range. - Platelet count

## 2019-01-08 LAB — LIPID PANEL
Chol/HDL Ratio: 4.5 ratio — ABNORMAL HIGH (ref 0.0–4.4)
Cholesterol, Total: 200 mg/dL — ABNORMAL HIGH (ref 100–199)
HDL: 44 mg/dL (ref >39)
LDL Calculated: 123 mg/dL — ABNORMAL HIGH (ref 0–99)
Triglycerides: 166 mg/dL — ABNORMAL HIGH (ref 0–149)
VLDL Cholesterol Cal: 33 mg/dL (ref 5–40)

## 2019-01-08 LAB — PLATELET COUNT: Platelets: 379 10*3/uL (ref 150–450)

## 2019-01-08 LAB — SEDIMENTATION RATE: Sed Rate: 13 mm/hr (ref 0–40)

## 2019-04-09 ENCOUNTER — Other Ambulatory Visit: Payer: Self-pay

## 2019-04-09 ENCOUNTER — Ambulatory Visit (INDEPENDENT_AMBULATORY_CARE_PROVIDER_SITE_OTHER): Payer: BLUE CROSS/BLUE SHIELD | Admitting: Family Medicine

## 2019-04-09 VITALS — BP 130/84 | HR 76 | Ht 65.0 in | Wt 228.0 lb

## 2019-04-09 DIAGNOSIS — R69 Illness, unspecified: Secondary | ICD-10-CM | POA: Diagnosis not present

## 2019-04-09 DIAGNOSIS — I10 Essential (primary) hypertension: Secondary | ICD-10-CM | POA: Diagnosis not present

## 2019-04-09 DIAGNOSIS — E034 Atrophy of thyroid (acquired): Secondary | ICD-10-CM

## 2019-04-09 NOTE — Progress Notes (Signed)
Date:  04/09/2019   Name:  Molly Horn   DOB:  12-10-1960   MRN:  [id number]   Chief Complaint: Follow-up  Muscle Pain This is a chronic problem. The current episode started more than 1 year ago. The problem occurs constantly. The problem has been waxing and waning since onset. The pain is present in the right hand, lower back, right shoulder, right foot and left foot. The pain is mild. Nothing aggravates the symptoms. Pertinent negatives include no abdominal pain, chest pain, constipation, diarrhea, dysuria, eye pain, fatigue, fever, headaches, joint swelling, nausea, rash, stiffness, sensory change, shortness of breath, swollen glands, urinary symptoms, vaginal discharge, visual change, vomiting, weakness or wheezing. Past treatments include nothing. The treatment provided mild relief. Swelling is present on the hands. Her past medical history is significant for chronic back pain.  Thyroid Problem Presents for follow-up visit. Symptoms include weight gain. Patient reports no anxiety, cold intolerance, constipation, depressed mood, diaphoresis, diarrhea, fatigue, heat intolerance, hoarse voice, leg swelling, menstrual problem, palpitations, visual change or weight loss. The symptoms have been stable.  Hypertension This is a chronic problem. The current episode started more than 1 year ago. The problem has been waxing and waning since onset. The problem is controlled. Pertinent negatives include no chest pain, headaches, palpitations or shortness of breath. Identifiable causes of hypertension include a thyroid problem.    Review of Systems  Constitutional: Positive for weight gain. Negative for chills, diaphoresis, fatigue, fever, unexpected weight change and weight loss.  HENT: Negative for congestion, ear discharge, ear pain, hoarse voice, rhinorrhea, sinus pressure, sneezing and sore throat.   Eyes: Negative for photophobia, pain, discharge, redness and itching.  Respiratory: Negative for  cough, shortness of breath, wheezing and stridor.   Cardiovascular: Negative for chest pain and palpitations.  Gastrointestinal: Negative for abdominal pain, blood in stool, constipation, diarrhea, nausea and vomiting.  Endocrine: Negative for cold intolerance, heat intolerance, polydipsia, polyphagia and polyuria.  Genitourinary: Negative for dysuria, flank pain, frequency, hematuria, menstrual problem, pelvic pain, urgency, vaginal bleeding and vaginal discharge.  Musculoskeletal: Negative for arthralgias, back pain, joint swelling, myalgias and stiffness.  Skin: Negative for rash.  Allergic/Immunologic: Negative for environmental allergies and food allergies.  Neurological: Negative for dizziness, sensory change, weakness, light-headedness, numbness and headaches.  Hematological: Negative for adenopathy. Does not bruise/bleed easily.  Psychiatric/Behavioral: Negative for dysphoric mood. The patient is not nervous/anxious.     Patient Active Problem List   Diagnosis Date Noted  . Left breast lump 05/23/2018  . 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 Routine history and physical examination of adult 10/26/2017  . Family history of colon cancer 01/25/2017  . Arthralgia 01/06/2017  . Degenerative lumbar disc 01/06/2017  . Lipoma 01/06/2017  . Pes anserine bursitis 01/06/2017  . Tenosynovitis, de Quervain 01/06/2017  . Trochanteric bursitis of both hips 01/06/2017  . Radial scar of breast 10/26/2016  . Asthma without status asthmaticus 01/12/2016  . Moderate obstructive sleep apnea 12/25/2015  . Gastritis 07/25/2012  . Hiatal hernia 07/25/2012  . Schatzki's ring 07/25/2012  . Acquired hypothyroidism 04/11/2011  . Obesity 04/11/2011  . Depression 04/11/2011  . Essential hypertension 04/11/2011  . Osteopenia 04/11/2011    Allergies  Allergen Reactions  . Wheat Bran Anaphylaxis  . Phentermine     Make her sick    Past Surgical History:  Procedure Laterality Date  . ABDOMINAL  HYSTERECTOMY  1996  . COLECTOMY     resection due to removal of mass benign  Social History   Tobacco Use  . Smoking status: Never Smoker  . Smokeless tobacco: Never Used  Substance Use Topics  . Alcohol use: Yes    Comment: socially  . Drug use: No     Medication list has been reviewed and updated.  Current Meds  Medication Sig  . albuterol (PROVENTIL HFA;VENTOLIN HFA) 108 (90 Base) MCG/ACT inhaler Inhale into the lungs every 6 (six) hours as needed for wheezing or shortness of breath.  Marland Kitchen aspirin 81 MG tablet Take 81 mg by mouth every other day.   . levothyroxine (SYNTHROID) 100 MCG tablet Take 1 tablet by mouth daily.  . meloxicam (MOBIC) 7.5 MG tablet Take 1 tablet (7.5 mg total) by mouth daily.  . Multiple Vitamin (MULTIVITAMIN) capsule Take 1 capsule by mouth daily.  . ramipril (ALTACE) 10 MG capsule Take 1 capsule (10 mg total) by mouth daily.  Marland Kitchen venlafaxine XR (EFFEXOR-XR) 150 MG 24 hr capsule Take 1 capsule by mouth daily. psych  . [DISCONTINUED] levothyroxine (SYNTHROID) 100 MCG tablet Take 1 tablet (100 mcg total) by mouth daily before breakfast.    PHQ 2/9 Scores 04/09/2019 01/07/2019  PHQ - 2 Score 0 1  PHQ- 9 Score 0 2    BP Readings from Last 3 Encounters:  04/09/19 130/84  01/07/19 138/70  10/03/15 135/69    Physical Exam Vitals signs and nursing note reviewed.  Constitutional:      Appearance: She is well-developed.  HENT:     Head: Normocephalic.     Right Ear: Tympanic membrane, ear canal and external ear normal.     Left Ear: Tympanic membrane, ear canal and external ear normal.     Nose: Nose normal. No congestion or rhinorrhea.     Mouth/Throat:     Mouth: Mucous membranes are moist.  Eyes:     General: Lids are everted, no foreign bodies appreciated. No scleral icterus.       Left eye: No foreign body or hordeolum.     Conjunctiva/sclera: Conjunctivae normal.     Right eye: Right conjunctiva is not injected.     Left eye: Left  conjunctiva is not injected.     Pupils: Pupils are equal, round, and reactive to light.  Neck:     Musculoskeletal: Normal range of motion and neck supple.     Thyroid: No thyromegaly.     Vascular: No JVD.     Trachea: No tracheal deviation.  Cardiovascular:     Rate and Rhythm: Normal rate and regular rhythm.     Heart sounds: Normal heart sounds. No murmur. No friction rub. No gallop.   Pulmonary:     Effort: Pulmonary effort is normal. No respiratory distress.     Breath sounds: Normal breath sounds. No wheezing, rhonchi or rales.  Abdominal:     General: Bowel sounds are normal.     Palpations: Abdomen is soft. There is no hepatomegaly, splenomegaly or mass.     Tenderness: There is no abdominal tenderness. There is no guarding or rebound.  Musculoskeletal: Normal range of motion.        General: No tenderness.  Lymphadenopathy:     Cervical: No cervical adenopathy.  Skin:    General: Skin is warm.     Findings: No rash.  Neurological:     Mental Status: She is alert and oriented to person, place, and time.     Cranial Nerves: No cranial nerve deficit.     Deep Tendon Reflexes: Reflexes normal.  Psychiatric:        Mood and Affect: Mood is not anxious or depressed.     Wt Readings from Last 3 Encounters:  04/09/19 228 lb (103.4 kg)  01/07/19 234 lb (106.1 kg)  10/03/15 220 lb (99.8 kg)    BP 130/84   Pulse 76   Ht 5\' 5"  (1.651 m)   Wt 228 lb (103.4 kg)   BMI 37.94 kg/m   Assessment and Plan: 1. Essential hypertension Chronic.  Controlled.  We will continue the ramipril but we will obtain a renal function panel to assess renal function. - Renal Function Panel  2. Hypothyroidism due to acquired atrophy of thyroid Chronic.  Controlled.  Will continue on current dosing of levothyroxine and will check a TSH to evaluate current control. - TSH  3. Taking medication for chronic disease Patient is on meloxicam on a regular basis now for control of hand pain and  back pain.  Will obtain renal function panel to evaluate GFR for continuance of therapy. - Renal Function Panel

## 2019-04-09 NOTE — Patient Instructions (Signed)

## 2019-04-10 LAB — RENAL FUNCTION PANEL
Albumin: 4.6 g/dL (ref 3.8–4.9)
BUN/Creatinine Ratio: 16 (ref 9–23)
BUN: 14 mg/dL (ref 6–24)
CO2: 25 mmol/L (ref 20–29)
Calcium: 10.1 mg/dL (ref 8.7–10.2)
Chloride: 101 mmol/L (ref 96–106)
Creatinine, Ser: 0.85 mg/dL (ref 0.57–1.00)
GFR calc Af Amer: 88 mL/min/{1.73_m2} (ref >59)
GFR calc non Af Amer: 76 mL/min/{1.73_m2} (ref >59)
Glucose: 91 mg/dL (ref 65–99)
Phosphorus: 3.9 mg/dL (ref 3.0–4.3)
Potassium: 5 mmol/L (ref 3.5–5.2)
Sodium: 141 mmol/L (ref 134–144)

## 2019-04-10 LAB — TSH: TSH: 1.3 u[IU]/mL (ref 0.450–4.500)

## 2019-04-16 LAB — LIPID PANEL WITH LDL/HDL RATIO
Cholesterol, Total: 219 mg/dL — ABNORMAL HIGH (ref 100–199)
HDL: 46 mg/dL (ref >39)
LDL Calculated: 133 mg/dL — ABNORMAL HIGH (ref 0–99)
LDl/HDL Ratio: 2.9 ratio (ref 0.0–3.2)
Triglycerides: 200 mg/dL — ABNORMAL HIGH (ref 0–149)
VLDL Cholesterol Cal: 40 mg/dL (ref 5–40)

## 2019-04-16 LAB — SPECIMEN STATUS REPORT

## 2019-04-24 ENCOUNTER — Other Ambulatory Visit: Payer: Self-pay

## 2019-04-24 ENCOUNTER — Encounter: Payer: BLUE CROSS/BLUE SHIELD | Admitting: Family Medicine

## 2019-04-24 DIAGNOSIS — G8929 Other chronic pain: Secondary | ICD-10-CM

## 2019-04-24 DIAGNOSIS — M545 Low back pain, unspecified: Secondary | ICD-10-CM

## 2019-08-31 ENCOUNTER — Other Ambulatory Visit: Payer: Self-pay | Admitting: Family Medicine

## 2019-08-31 DIAGNOSIS — M545 Low back pain, unspecified: Secondary | ICD-10-CM

## 2019-08-31 DIAGNOSIS — G8929 Other chronic pain: Secondary | ICD-10-CM

## 2019-08-31 DIAGNOSIS — M2550 Pain in unspecified joint: Secondary | ICD-10-CM

## 2019-09-30 ENCOUNTER — Other Ambulatory Visit: Payer: Self-pay | Admitting: Family Medicine

## 2019-09-30 DIAGNOSIS — E034 Atrophy of thyroid (acquired): Secondary | ICD-10-CM

## 2019-09-30 DIAGNOSIS — I10 Essential (primary) hypertension: Secondary | ICD-10-CM

## 2019-10-22 ENCOUNTER — Other Ambulatory Visit: Payer: Self-pay | Admitting: Family Medicine

## 2019-10-22 DIAGNOSIS — I10 Essential (primary) hypertension: Secondary | ICD-10-CM

## 2019-10-23 ENCOUNTER — Other Ambulatory Visit: Payer: Self-pay | Admitting: Family Medicine

## 2019-10-23 DIAGNOSIS — E034 Atrophy of thyroid (acquired): Secondary | ICD-10-CM

## 2024-09-10 ENCOUNTER — Ambulatory Visit
Admission: EM | Admit: 2024-09-10 | Discharge: 2024-09-10 | Disposition: A | Discharge status: Home / Self Care | Attending: Emergency Medicine | Admitting: Emergency Medicine

## 2024-09-10 DIAGNOSIS — H81399 Other peripheral vertigo, unspecified ear: Secondary | ICD-10-CM | POA: Diagnosis not present

## 2024-09-10 MED ORDER — ONDANSETRON 8 MG PO TBDP
8.0000 mg | ORAL_TABLET | Freq: Once | ORAL | Status: AC
  Administered 2024-09-10: 8 mg via ORAL

## 2024-09-10 MED ORDER — MECLIZINE HCL 25 MG PO TABS
25.0000 mg | ORAL_TABLET | Freq: Once | ORAL | Status: AC
  Administered 2024-09-10: 25 mg via ORAL

## 2024-09-10 MED ORDER — MECLIZINE HCL 25 MG PO TABS: 25.0000 mg | ORAL_TABLET | Freq: Three times a day (TID) | ORAL | 0 refills | Status: AC | PRN

## 2024-09-10 NOTE — Discharge Instructions (Signed)
 I would do the Epley maneuver on the right since that the 1 that seemed worse.  Take the meclizine  as needed.  Zofran  for nausea and vomiting.  Push electrolyte containing fluids.  Follow-up with an ear nose throat specialist of your choice.  If your symptoms return, get worse, you start having any strokelike symptoms such as slurred speech, inability to walk independently, difficulty finding your words or getting your words out, unilateral face/arm/leg tingling, numbness, weakness, chest pain, shortness of breath, or for any other concerns, go immediately to the emergency department.

## 2024-09-10 NOTE — ED Triage Notes (Signed)
 Patient to Urgent Care with complaints of nausea/ dizziness. States her head is spinning.  Woke up with symptoms. Hx of vertigo but states this is more severe.

## 2024-09-10 NOTE — ED Provider Notes (Signed)
 " HPI  SUBJECTIVE:  Molly Horn is a 64 y.o. female who presents with waking up at 0700 this morning with constant dizziness/vertigo.  She is unable to walk unassisted due to feeling so off balance.  She reports nausea, but no vomiting, headache, slurred speech, facial droop, unilateral numbness/tingling/weakness in her face/arm/leg, ear pain, tinnitus, chest pain, palpitation, shortness of breath, neck pain, visual changes.  No recent preceding viral illnesses.  She has had episodes of vertigo in the past, but today's is more severe than usual.  She has 2-3 episodes over the past month.  She tried an Teaching laboratory technician at home and rest.  The home Epley maneuver made her symptoms worse.  Symptoms are also worse with turning her head and rolling over in bed.  Symptoms are better when she closes her eyes.  She has a past medical history of asthma, hypothyroidism, hypertension, GERD, status post gastric bypass, vertigo.  No history of CVA, aneurysm, MI, palpitations, arrhythmia, PCP: [facility].  Past Medical History:  Diagnosis Date   Acute sinusitis    Allergy    Anxiety    Asthma    Depression    Gastritis    GERD (gastroesophageal reflux disease)    Hiatal hernia    Hypertension    Hypothyroidism    Sleep apnea    has CPAP    Past Surgical History:  Procedure Laterality Date   ABDOMINAL HYSTERECTOMY  1996   COLECTOMY     resection due to removal of mass benign   GASTRIC BYPASS      Family History  Problem Relation Age of Onset   Heart disease Mother    Stroke Mother    Cancer Father    Hypertension Father    Heart disease Father     Social History[1]  Current Medications[2]  Allergies[3]   ROS  As noted in HPI.   Physical Exam  BP 139/81   Pulse 63   Temp (!) 97.5 F (36.4 C)   Resp 18   Ht 5' 5 (1.651 m)   Wt 77.1 kg   SpO2 100%   BMI 28.29 kg/m   Constitutional: Well developed, well nourished, appears uncomfortable. Eyes: PERRL, EOMI,  conjunctiva normal bilaterally HENT: Normocephalic, atraumatic,mucus membranes moist. TM's normal bilaterally Respiratory: normal inspiratory effort Cardiovascular: Normal rate and rhythm, no murmurs, no gallops, no rubs. No carotid bruit GI: nondistended Musculoskeletal: No edema, no tenderness, no deformities Neurologic: Alert & oriented x 3, CN III-XII  intact, no facial droop, speech fluent, patient able to walk unassisted easily.  Finger-nose, heel-shin within normal limits.  Upper lower extremity strength normal and equal bilaterally.  Unable to perform tandem gait., sensation grossly intact.  Positive Dix-Hallpike bilaterally.  Symptoms worse with sitting up.  Symptoms resolve/improve after 30 seconds to a minute.  Psychiatric: Speech and behavior appropriate  ED Course   Medications  meclizine  (ANTIVERT ) tablet 25 mg (25 mg Oral Given 09/10/24 1040)  ondansetron  (ZOFRAN -ODT) disintegrating tablet 8 mg (8 mg Oral Given 09/10/24 1039)    No orders of the defined types were placed in this encounter.  No results found for this or any previous visit (from the past 24 hours). No results found.  ED Clinical Impression  1. Peripheral vertigo, unspecified laterality      ED Assessment/Plan   Patient is unable to perform tandem gait, but her walking is preserved.  It seems to be very reproducible with head movement and large positional changes, and it  gets better when she sits still.  There are no other neurologic signs and symptoms such as blurry vision, dysarthria, other focal or lateralized weakness.  Giving meclizine  25 mg and Zofran  8.  Will reevaluate.  On reevaluation, patient states she feels significantly better.  She is eating chips and is able to perform tandem gait.  Presentation consistent with a peripheral vertigo.  Will send home with meclizine  25 mg 3 times daily.  She has Zofran  as needed.  Offered referral to [facility], but she is in [facility] network.  She will  follow-up with the ENT of her choice at [facility].  Strict ER return precautions given.  Discussed MDM, treatment plan, and plan for follow-up with patient Discussed sn/sx that should prompt return to the ED. patient agrees with plan.   Meds ordered this encounter  Medications   meclizine  (ANTIVERT ) tablet 25 mg   ondansetron  (ZOFRAN -ODT) disintegrating tablet 8 mg   meclizine  (ANTIVERT ) 25 MG tablet    Sig: Take 1 tablet (25 mg total) by mouth 3 (three) times daily as needed for dizziness.    Dispense:  30 tablet    Refill:  0      *This clinic note was created using Scientist, clinical (histocompatibility and immunogenetics). Therefore, there may be occasional mistakes despite careful proofreading. ?      [1]  Social History Tobacco Use   Smoking status: Never   Smokeless tobacco: Never  Vaping Use   Vaping status: Never Used  Substance Use Topics   Alcohol use: Not Currently    Comment: socially   Drug use: No  [2] No current facility-administered medications for this encounter.  Current Outpatient Medications:    levothyroxine  (SYNTHROID ) 100 MCG tablet, TAKE 1 TABLET BY MOUTH DAILY BEFORE BREAKFAST., Disp: 15 tablet, Rfl: 0   meclizine  (ANTIVERT ) 25 MG tablet, Take 1 tablet (25 mg total) by mouth 3 (three) times daily as needed for dizziness., Disp: 30 tablet, Rfl: 0   ramipril  (ALTACE ) 10 MG capsule, TAKE 1 CAPSULE BY MOUTH EVERY DAY, Disp: 15 capsule, Rfl: 0   sertraline (ZOLOFT) 100 MG tablet, Take 100 mg by mouth., Disp: , Rfl:    albuterol  (PROVENTIL  HFA;VENTOLIN  HFA) 108 (90 Base) MCG/ACT inhaler, Inhale into the lungs every 6 (six) hours as needed for wheezing or shortness of breath., Disp: , Rfl:    aspirin 81 MG tablet, Take 81 mg by mouth every other day.  (Patient not taking: Reported on 09/10/2024), Disp: , Rfl:    meloxicam  (MOBIC ) 7.5 MG tablet, TAKE 1 TABLET BY MOUTH EVERY DAY, Disp: 30 tablet, Rfl: 1   Multiple Vitamin (MULTIVITAMIN) capsule, Take 1 capsule by mouth daily., Disp: , Rfl:     venlafaxine XR (EFFEXOR-XR) 150 MG 24 hr capsule, Take 1 capsule by mouth daily. psych (Patient not taking: Reported on 09/10/2024), Disp: , Rfl:  [3]  Allergies Allergen Reactions   Wheat Anaphylaxis   Phentermine     Make her sick     Van Knee, MD 09/10/24 1125  "
